# Patient Record
Sex: Female | Born: 1970 | Race: White | Hispanic: No | Marital: Married | Smoking: Former smoker
Health system: Southern US, Community
[De-identification: ages and names within clinical notes are randomized; demographics above are authoritative.]

---

## 2010-01-21 ENCOUNTER — Encounter (INDEPENDENT_AMBULATORY_CARE_PROVIDER_SITE_OTHER): Payer: Self-pay | Admitting: Obstetrics and Gynecology

## 2010-01-21 ENCOUNTER — Inpatient Hospital Stay (HOSPITAL_COMMUNITY): Admission: RE | Admit: 2010-01-21 | Discharge: 2010-01-24 | Payer: Self-pay | Admitting: Obstetrics and Gynecology

## 2011-03-05 LAB — CBC
HCT: 36.6 % (ref 36.0–46.0)
HCT: 41.5 % (ref 36.0–46.0)
Hemoglobin: 12.2 g/dL (ref 12.0–15.0)
Hemoglobin: 12.6 g/dL (ref 12.0–15.0)
MCHC: 33.5 g/dL (ref 30.0–36.0)
MCHC: 34.6 g/dL (ref 30.0–36.0)
MCV: 89.3 fL (ref 78.0–100.0)
Platelets: 127 10*3/uL — ABNORMAL LOW (ref 150–400)
RBC: 4.1 MIL/uL (ref 3.87–5.11)
RBC: 4.11 MIL/uL (ref 3.87–5.11)
WBC: 7.7 10*3/uL (ref 4.0–10.5)
WBC: 8.1 10*3/uL (ref 4.0–10.5)
WBC: 8.6 10*3/uL (ref 4.0–10.5)

## 2011-03-05 LAB — TYPE AND SCREEN: ABO/RH(D): B POS

## 2011-03-05 LAB — ABO/RH: ABO/RH(D): B POS

## 2012-07-08 ENCOUNTER — Encounter: Payer: Self-pay | Admitting: Family Medicine

## 2012-07-08 ENCOUNTER — Ambulatory Visit (INDEPENDENT_AMBULATORY_CARE_PROVIDER_SITE_OTHER): Payer: 59 | Admitting: Family Medicine

## 2012-07-08 VITALS — BP 92/54 | HR 67 | Temp 98.2°F | Resp 16 | Ht 69.25 in | Wt 184.2 lb

## 2012-07-08 DIAGNOSIS — E7800 Pure hypercholesterolemia, unspecified: Secondary | ICD-10-CM

## 2012-07-08 DIAGNOSIS — R031 Nonspecific low blood-pressure reading: Secondary | ICD-10-CM

## 2012-07-08 DIAGNOSIS — E663 Overweight: Secondary | ICD-10-CM | POA: Insufficient documentation

## 2012-07-08 LAB — COMPREHENSIVE METABOLIC PANEL
Alkaline Phosphatase: 42 U/L (ref 39–117)
BUN: 15 mg/dL (ref 6–23)
CO2: 27 mEq/L (ref 19–32)
Creat: 0.83 mg/dL (ref 0.50–1.10)
Glucose, Bld: 77 mg/dL (ref 70–99)
Sodium: 137 mEq/L (ref 135–145)
Total Bilirubin: 0.7 mg/dL (ref 0.3–1.2)

## 2012-07-08 LAB — LIPID PANEL
Cholesterol: 210 mg/dL — ABNORMAL HIGH (ref 0–200)
HDL: 43 mg/dL (ref >39)
LDL Cholesterol: 138 mg/dL — ABNORMAL HIGH (ref 0–99)
Total CHOL/HDL Ratio: 4.9 Ratio
Triglycerides: 145 mg/dL (ref <150)
VLDL: 29 mg/dL (ref 0–40)

## 2012-07-08 NOTE — Progress Notes (Signed)
S: Pt has hx of high cholesterol for years. Also thinks both parents take statin drugs. Nutrition is fairly healthy and pt stays active with 2 small children.     Re: Low BP- always SBP </= 100. She denies and dizziness or lightheadedness, palpitations or chest discomfort. Maintains good hydration.       ROS: Noncontributory except as per HPI   O: Vital signs and nurse notes reviewed   In NAD; WN, WD      HENT: [place]/AT;  EOMI, conj/sclerae clear      COR: RRR      LUNGS: Normal resp rate and effort      NEURO: Nonfocal  A/P: 1. Hypercholesterolemia  Comprehensive metabolic panel, Lipid panel, TSH  2. Low blood pressure, not hypotension  Monitor; maintain good hydration and can be a little liberal with salt intake

## 2012-07-08 NOTE — Patient Instructions (Signed)

## 2012-07-12 NOTE — Progress Notes (Signed)
Quick Note:  Please call pt and advise that the following labs are abnormal... Lipid levels are mildly elevated. Continue to work on healthy nutrition and regular exercise. Try Flax seed Oil supplement- 1000 to 1200 mg capsule- and take 1 capsule daily.  You may want to have lipids rechecked in 12 months.  Copy to pt. ______

## 2012-09-19 ENCOUNTER — Other Ambulatory Visit: Payer: Self-pay | Admitting: Obstetrics and Gynecology

## 2012-09-19 DIAGNOSIS — Z1231 Encounter for screening mammogram for malignant neoplasm of breast: Secondary | ICD-10-CM

## 2012-10-20 ENCOUNTER — Ambulatory Visit
Admission: RE | Admit: 2012-10-20 | Discharge: 2012-10-20 | Disposition: A | Discharge status: Home / Self Care | Payer: 59 | Source: Ambulatory Visit | Attending: Obstetrics and Gynecology | Admitting: Obstetrics and Gynecology

## 2012-10-20 DIAGNOSIS — Z1231 Encounter for screening mammogram for malignant neoplasm of breast: Secondary | ICD-10-CM

## 2013-09-27 ENCOUNTER — Other Ambulatory Visit: Payer: Self-pay

## 2013-09-27 DIAGNOSIS — Z1231 Encounter for screening mammogram for malignant neoplasm of breast: Secondary | ICD-10-CM

## 2013-10-23 ENCOUNTER — Ambulatory Visit
Admission: RE | Admit: 2013-10-23 | Discharge: 2013-10-23 | Disposition: A | Discharge status: Home / Self Care | Payer: 59 | Source: Ambulatory Visit

## 2013-10-23 DIAGNOSIS — Z1231 Encounter for screening mammogram for malignant neoplasm of breast: Secondary | ICD-10-CM

## 2015-03-04 ENCOUNTER — Ambulatory Visit (INDEPENDENT_AMBULATORY_CARE_PROVIDER_SITE_OTHER): Payer: 59 | Admitting: Internal Medicine

## 2015-03-04 VITALS — BP 110/64 | HR 64 | Temp 97.9°F | Resp 18 | Ht 69.25 in | Wt 188.6 lb

## 2015-03-04 DIAGNOSIS — Z23 Encounter for immunization: Secondary | ICD-10-CM | POA: Diagnosis not present

## 2015-03-04 NOTE — Patient Instructions (Signed)
Immunization Schedule, Adult  Influenza vaccine.  All adults should be immunized every year.  All adults, including pregnant women and people with hives-only allergy to eggs can receive the inactivated influenza (IIV) vaccine.  Adults aged 42-49 years can receive the recombinant influenza (RIV) vaccine. The RIV vaccine does not contain any egg protein.  Adults aged 76 years or older can receive the standard-dose IIV or the high-dose IIV.  Tetanus, diphtheria, and acellular pertussis (Td, Tdap) vaccine.  Pregnant women should receive 1 dose of Tdap vaccine during each pregnancy. The dose should be obtained regardless of the length of time since the last dose. Immunization is preferred during the 27th to 36th week of gestation.  An adult who has not previously received Tdap or who does not know his or her vaccine status should receive 1 dose of Tdap. This initial dose should be followed by tetanus and diphtheria toxoids (Td) booster doses every 10 years.  Adults with an unknown or incomplete history of completing a 3-dose immunization series with Td-containing vaccines should begin or complete a primary immunization series including a Tdap dose.  Adults should receive a Td booster every 10 years.  Varicella vaccine.  An adult without evidence of immunity to varicella should receive 2 doses or a second dose if he or she has previously received 1 dose.  Pregnant females who do not have evidence of immunity should receive the first dose after pregnancy. This first dose should be obtained before leaving the health care facility. The second dose should be obtained 4-8 weeks after the first dose.  Human papillomavirus (HPV) vaccine.  Females aged 13-26 years who have not received the vaccine previously should obtain the 3-dose series.  The vaccine is not recommended for use in pregnant females. However, pregnancy testing is not needed before receiving a dose. If a female is found to be  pregnant after receiving a dose, no treatment is needed. In that case, the remaining doses should be delayed until after the pregnancy.  Males aged 37-21 years who have not received the vaccine previously should receive the 3-dose series. Males aged 22-26 years may be immunized.  Immunization is recommended through the age of 93 years for any female who has sex with males and did not get any or all doses earlier.  Immunization is recommended for any person with an immunocompromised condition through the age of 71 years if he or she did not get any or all doses earlier.  During the 3-dose series, the second dose should be obtained 4-8 weeks after the first dose. The third dose should be obtained 24 weeks after the first dose and 16 weeks after the second dose.  Zoster vaccine.  One dose is recommended for adults aged 73 years or older unless certain conditions are present.  Measles, mumps, and rubella (MMR) vaccine.  Adults born before 35 generally are considered immune to measles and mumps.  Adults born in [birth year] or later should have 1 or more doses of MMR vaccine unless there is a contraindication to the vaccine or there is laboratory evidence of immunity to each of the three diseases.  A routine second dose of MMR vaccine should be obtained at least 28 days after the first dose for students attending postsecondary schools, health care workers, or international travelers.  People who received inactivated measles vaccine or an unknown type of measles vaccine during 1963-1967 should receive 2 doses of MMR vaccine.  People who received inactivated mumps vaccine or an unknown type  of mumps vaccine before 1979 and are at high risk for mumps infection should consider immunization with 2 doses of MMR vaccine.  For females of childbearing age, rubella immunity should be determined. If there is no evidence of immunity, females who are not pregnant should be vaccinated. If there is no evidence of  immunity, females who are pregnant should delay immunization until after pregnancy.  Unvaccinated health care workers born before 1957 who lack laboratory evidence of measles, mumps, or rubella immunity or laboratory confirmation of disease should consider measles and mumps immunization with 2 doses of MMR vaccine or rubella immunization with 1 dose of MMR vaccine.  Pneumococcal 13-valent conjugate (PCV13) vaccine.  When indicated, a person who is uncertain of his or her immunization history and has no record of immunization should receive the PCV13 vaccine.  An adult aged 19 years or older who has certain medical conditions and has not been previously immunized should receive 1 dose of PCV13 vaccine. This PCV13 should be followed with a dose of pneumococcal polysaccharide (PPSV23) vaccine. The PPSV23 vaccine dose should be obtained at least 8 weeks after the dose of PCV13 vaccine.  An adult aged 19 years or older who has certain medical conditions and previously received 1 or more doses of PPSV23 vaccine should receive 1 dose of PCV13. The PCV13 vaccine dose should be obtained 1 or more years after the last PPSV23 vaccine dose.  Pneumococcal polysaccharide (PPSV23) vaccine.  When PCV13 is also indicated, PCV13 should be obtained first.  All adults aged 65 years and older should be immunized.  An adult younger than age 65 years who has certain medical conditions should be immunized.  Any person who resides in a nursing home or long-term care facility should be immunized.  An adult smoker should be immunized.  People with an immunocompromised condition and certain other conditions should receive both PCV13 and PPSV23 vaccines.  People with human immunodeficiency virus (HIV) infection should be immunized as soon as possible after diagnosis.  Immunization during chemotherapy or radiation therapy should be avoided.  Routine use of PPSV23 vaccine is not recommended for American Indians,  [place] Natives, or people younger than 65 years unless there are medical conditions that require PPSV23 vaccine.  When indicated, people who have unknown immunization and have no record of immunization should receive PPSV23 vaccine.  One-time revaccination 5 years after the first dose of PPSV23 is recommended for people aged 19-64 years who have chronic kidney failure, nephrotic syndrome, asplenia, or immunocompromised conditions.  People who received 1-2 doses of PPSV23 before age 65 years should receive another dose of PPSV23 vaccine at age 65 years or later if at least 5 years have passed since the previous dose.  Doses of PPSV23 are not needed for people immunized with PPSV23 at or after age 65 years.  Meningococcal vaccine.  Adults with asplenia or persistent complement component deficiencies should receive 2 doses of quadrivalent meningococcal conjugate (MenACWY-D) vaccine. The doses should be obtained at least 2 months apart.  Microbiologists working with certain meningococcal bacteria, military recruits, people at risk during an outbreak, and people who travel to or live in countries with a high rate of meningitis should be immunized.  A first-year college student up through age 21 years who is living in a residence hall should receive a dose if he or she did not receive a dose on or after his or her 16th birthday.  Adults who have certain high-risk conditions should receive one or more doses   of vaccine.  Hepatitis A vaccine.  Adults who wish to be protected from this disease, have certain high-risk conditions, work with hepatitis A-infected animals, work in hepatitis A research labs, or travel to or work in countries with a high rate of hepatitis A should be immunized.  Adults who were previously unvaccinated and who anticipate close contact with an international adoptee during the first 60 days after arrival in the [place] from a country with a high rate of hepatitis A should  be immunized.  Hepatitis B vaccine.  Adults who wish to be protected from this disease, have certain high-risk conditions, may be exposed to blood or other infectious body fluids, are household contacts or sex partners of hepatitis B positive people, are clients or workers in certain care facilities, or travel to or work in countries with a high rate of hepatitis B should be immunized.  Haemophilus influenzae type b (Hib) vaccine.  A previously unvaccinated person with asplenia or sickle cell disease or having a scheduled splenectomy should receive 1 dose of Hib vaccine.  Regardless of previous immunization, a recipient of a hematopoietic stem cell transplant should receive a 3-dose series 6-12 months after his or her successful transplant.  Hib vaccine is not recommended for adults with HIV infection. Document Released: 02/20/2004 Document Revised: 03/27/2013 Document Reviewed: 01/17/2013 [facility] Patient Information 2015 [place], [place]. This information is not intended to replace advice given to you by your health care provider. Make sure you discuss any questions you have with your health care provider. Immunization Information for Foreign Travel Immunizations can protect you from certain diseases. Immunizations can also prevent the spread of certain infections. It is important to see your caregiver or a travel medicine specialist 4-6 weeks before you travel. This allows time for vaccines to take effect. It also provides enough time for you to get vaccines that must be given in a series over a period of days or weeks. Immunizations for travelers include:  Routine vaccines. These vaccines are standard for the people in a country.  Recommended vaccines. These vaccines are recommended before travel to some countries or regions.  Required vaccines. These vaccines are necessary before travel to specific countries or regions. If it is less than 4 weeks before you leave, you should still see your  caregiver. You might still benefit from vaccines or medicines. WHAT ARE THE ROUTINE VACCINES? Routine vaccines can protect you from diseases that are common in many parts of the world. Most routine vaccines are given at specific ages during your life. However, routine vaccines also include the annual flu (influenza) vaccine. You should be up to date on your routine immunizations before you travel. Your caregiver will be able to review your vaccine history and determine whether you have had all the routine vaccines. You may be advised to get extra doses or booster vaccines even if you are up to date on the routine vaccines. WHAT ARE THE RECOMMENDED VACCINES? Know your travel schedule when you visit your caregiver. The vaccines recommended before foreign travel will depend on several factors, including:  The country or countries of travel.  Whether you will travel to rural areas.  The length of time you will be traveling.  The season of the year.  Your age.  Your health status.  Your previous immunizations. Vaccine recommendations change over time. Your caregiver can tell you what vaccines are recommended before your trip. The annual influenza vaccine sometimes differs for the [place] and [place] hemispheres. Unless the annual vaccines  are the same in both hemispheres, people with certain chronic medical conditions who are traveling to the other hemisphere shortly before or during the influenza season should also get the other influenza vaccine. The other influenza vaccine should be obtained either before leaving the country or shortly after arrival at the travel site. WHAT ARE THE REQUIRED VACCINES? Vaccines may be required during a current outbreak of an infectious disease in a country or region. Your caregiver will be able to tell you about any current outbreaks and required vaccines. For example, proof of yellow fever immunization is currently required for most people before traveling to  certain countries in [place] and [place]. This vaccine can only be obtained at approved centers. You should get the yellow fever vaccine at least 10 days before your trip. After 10 days, most people show immunity to yellow fever. If it has been longer than 10 years since you received the yellow fever vaccine, another dose is required. If proof of immunization is incomplete or inaccurate, you could be quarantined, denied entry, or given another dose of vaccine at the travel site. If you cannot receive the yellow fever vaccine because of medical reasons, you must have a written statement from your caregiver. The statement must contain a medical reason for the lack of immunization. In such a case, your caregiver should then give you advice on how to decrease your chance of getting yellow fever. That advice should include taking precautions to avoid mosquito bites and limiting outdoor time. Other than having a medical condition or being under the age of 36 months, no other reasons will be accepted for not getting the vaccine.  Proof of meningococcal immunization is required by the [place] for any person older than 2 years who is taking part in the [place] or [place]. Visas for traveling to the [place] or [place] will not even be issued until there is proof of immunization. You should get this vaccine at least 10 days before your trip. After 10 days, most people show immunity. If it has been longer than 3 years since your last immunization, another dose is required. FOR MORE INFORMATION  Centers for Disease Control and Prevention (CDC): http://www.wolf.info/  World Health Organization [facility]): RoleLink.com.br Document Released: 11/18/2009 Document Revised: 04/16/2014 Document Reviewed: 10/28/2012 [facility] Patient Information 2015 [place], [place]. This information is not intended to replace advice given to you by your health care provider. Make sure you discuss any questions you have with your health care  provider.

## 2015-03-04 NOTE — Progress Notes (Signed)
   Subjective:    Patient ID: Lindsey Krueger, female    DOB: 1971-06-14, 44 y.o.   MRN: [id number]  HPI Scribed by Remi HaggardJulie Greer LPN  44 year old female going to [place] 03/09/15 update on immunization.  Agrees to Hep A first dose, Tdap . Will consider flu shot after vacation.    Review of Systems     Objective:   Physical Exam  Constitutional: She is oriented to person, place, and time. She appears well-developed and well-nourished.  HENT:  Head: Normocephalic.  Eyes: EOM are normal. No scleral icterus.  Neck: Normal range of motion.  Pulmonary/Chest: Effort normal.  Neurological: She is alert and oriented to person, place, and time. She exhibits normal muscle tone. Coordination normal.  Psychiatric: She has a normal mood and affect.          Assessment & Plan:  Tdap/First Hep A Return 6 months second hep A

## 2015-04-25 LAB — HM PAP SMEAR: HM Pap smear: NEGATIVE

## 2015-05-02 ENCOUNTER — Encounter: Payer: Self-pay | Admitting: Physician Assistant

## 2015-06-12 ENCOUNTER — Ambulatory Visit (INDEPENDENT_AMBULATORY_CARE_PROVIDER_SITE_OTHER): Payer: 59 | Admitting: Physician Assistant

## 2015-06-12 VITALS — BP 121/82 | HR 81 | Temp 98.9°F | Resp 18 | Wt 189.0 lb

## 2015-06-12 DIAGNOSIS — J9801 Acute bronchospasm: Secondary | ICD-10-CM | POA: Diagnosis not present

## 2015-06-12 DIAGNOSIS — J209 Acute bronchitis, unspecified: Secondary | ICD-10-CM | POA: Diagnosis not present

## 2015-06-12 MED ORDER — HYDROCOD POLST-CPM POLST ER 10-8 MG/5ML PO SUER: 5.0000 mL | Freq: Two times a day (BID) | ORAL | Status: DC | PRN

## 2015-06-12 MED ORDER — ALBUTEROL SULFATE (2.5 MG/3ML) 0.083% IN NEBU
2.5000 mg | INHALATION_SOLUTION | Freq: Once | RESPIRATORY_TRACT | Status: AC
  Administered 2015-06-12: 2.5 mg via RESPIRATORY_TRACT

## 2015-06-12 MED ORDER — IPRATROPIUM BROMIDE 0.02 % IN SOLN
0.5000 mg | Freq: Once | RESPIRATORY_TRACT | Status: AC
  Administered 2015-06-12: 0.5 mg via RESPIRATORY_TRACT

## 2015-06-12 MED ORDER — ALBUTEROL SULFATE HFA 108 (90 BASE) MCG/ACT IN AERS: 2.0000 | INHALATION_SPRAY | RESPIRATORY_TRACT | Status: AC | PRN

## 2015-06-12 NOTE — Patient Instructions (Signed)
Continue mucinex twice a day. Cough syrup at night for sleep. Use albuterol inhaler every 4 hours as needed for chest tightness/coughing fits. Call in 7-10 days if your symptoms are still not improving.

## 2015-06-12 NOTE — Progress Notes (Signed)
[facility] [address], [place] [address] [phone]  Date:  06/12/2015   Name:  Lindsey Krueger   DOB:  21-Nov-1971   MRN:  [id number]  PCP:  No primary care provider on file.    Chief Complaint: Cough and URI   History of Present Illness:  This is a 44 y.o. female who is presenting with URI symptoms x 2 weeks.  Cough: Started with cough. productive of white-green thick mucus. Worse in the morning. She is awoken frequently at night from coughing. Chest feels tight. She has frequent coughing fits. SOB/wheezing: no Nasal congestion: started in past 2 days. Otalgia: No Sore throat: No Fever/chills: No  Aggravating/alleviating factors: robitussin and mucinex but no significant help.  Sick contacts: 60 year old son with same illness, lasted 2 weeks, but better now. History of asthma: no History of env allergies: yes but usually only in spring. Already had problems with allergies this year and cleared up. Doesn't take anything daily for allergies. Tobacco use: No   Review of Systems:  Review of Systems See HPI.  Patient Active Problem List   Diagnosis Date Noted  . Hypercholesterolemia 07/08/2012  . Low blood pressure, not hypotension 07/08/2012  . Overweight (BMI 25.0-29.9) 07/08/2012    Prior to Admission medications   Not on File    Allergies  Allergen Reactions  . Bactrim [Sulfamethoxazole-Trimethoprim] Other (See Comments)    Not sure  . Sulfa Antibiotics Other (See Comments)    Per pt told by mother, not sure what happens    Past Surgical History  Procedure Laterality Date  . Cesarean section      twice    History  Substance Use Topics  . Smoking status: Former Smoker -- 1.00 packs/day for 7 years    Types: Cigarettes    Quit date: 11/07/1997  . Smokeless tobacco: Not on file  . Alcohol Use: Yes     Comment: drink mixed, beer, and wine    Family History  Problem Relation Age of Onset  . Diabetes Father 23    type 2   . Cancer Maternal Grandmother 42    pancreatic   . Cancer Maternal Grandfather     prostate    Medication list has been reviewed and updated.  Physical Examination:  Physical Exam  Constitutional: She is oriented to person, place, and time. She appears well-developed and well-nourished. No distress.  HENT:  Head: Normocephalic and atraumatic.  Right Ear: Hearing, tympanic membrane, external ear and ear canal normal.  Left Ear: Hearing, tympanic membrane, external ear and ear canal normal.  Nose: Nose normal. Right sinus exhibits no maxillary sinus tenderness and no frontal sinus tenderness. Left sinus exhibits no maxillary sinus tenderness and no frontal sinus tenderness.  Mouth/Throat: Uvula is midline, oropharynx is clear and moist and mucous membranes are normal.  Eyes: Conjunctivae and lids are normal. Right eye exhibits no discharge. Left eye exhibits no discharge. No scleral icterus.  Cardiovascular: Normal rate, regular rhythm, normal heart sounds and normal pulses.   No murmur heard. Pulmonary/Chest: Effort normal. No respiratory distress. She has decreased breath sounds (mildly, throughout). She has no wheezes. She has no rhonchi. She has no rales.  Musculoskeletal: Normal range of motion.  Lymphadenopathy:       Head (right side): No submental, no submandibular and no tonsillar adenopathy present.       Head (left side): No submental, no submandibular and no tonsillar adenopathy present.  She has no cervical adenopathy.  Neurological: She is alert and oriented to person, place, and time.  Skin: Skin is warm, dry and intact. No lesion and no rash noted.  Psychiatric: She has a normal mood and affect. Her speech is normal and behavior is normal. Thought content normal.   BP 121/82 mmHg  Pulse 81  Temp(Src) 98.9 F (37.2 C) (Oral)  Resp 18  Wt 189 lb (85.73 kg)  SpO2 99%  LMP 05/24/2015 (LMP Unknown)  Assessment and Plan:  1. Bronchospasm 2. Acute  bronchitis Symptoms improved with duoneb. Vitals normal. Pt is feeling ok except for lingering cough. Pt will continue BID home mucinex. Prescribed tussionex and prn albuterol. Return in 7-10 days if symptoms not improving. - albuterol (PROVENTIL) (2.5 MG/3ML) 0.083% nebulizer solution 2.5 mg; Take 3 mLs (2.5 mg total) by nebulization once. - ipratropium (ATROVENT) nebulizer solution 0.5 mg; Take 2.5 mLs (0.5 mg total) by nebulization once. - albuterol (PROVENTIL HFA;VENTOLIN HFA) 108 (90 BASE) MCG/ACT inhaler; Inhale 2 puffs into the lungs every 4 (four) hours as needed for wheezing or shortness of breath (cough, shortness of breath or wheezing.).  Dispense: 1 Inhaler; Refill: 1 - chlorpheniramine-HYDROcodone (TUSSIONEX PENNKINETIC ER) 10-8 MG/5ML SUER; Take 5 mLs by mouth every 12 (twelve) hours as needed for cough.  Dispense: 100 mL; Refill: 0   Roswell MinersNicole V. Dyke BrackettBush, PA-C, MHS [facility]  06/12/2015

## 2015-06-19 ENCOUNTER — Telehealth: Payer: Self-pay

## 2015-06-19 NOTE — Telephone Encounter (Signed)
Pt is not feeling any better and would like to know what to do next

## 2015-06-20 NOTE — Telephone Encounter (Signed)
Advised pt to RTC per Nicole's OV note. Pt understood.

## 2015-06-21 ENCOUNTER — Ambulatory Visit (INDEPENDENT_AMBULATORY_CARE_PROVIDER_SITE_OTHER): Payer: 59 | Admitting: Family Medicine

## 2015-06-21 ENCOUNTER — Ambulatory Visit (INDEPENDENT_AMBULATORY_CARE_PROVIDER_SITE_OTHER): Payer: 59

## 2015-06-21 VITALS — BP 116/76 | HR 64 | Temp 98.4°F | Resp 16 | Ht 70.0 in | Wt 195.0 lb

## 2015-06-21 DIAGNOSIS — R059 Cough, unspecified: Secondary | ICD-10-CM

## 2015-06-21 DIAGNOSIS — R058 Other specified cough: Secondary | ICD-10-CM

## 2015-06-21 DIAGNOSIS — R6884 Jaw pain: Secondary | ICD-10-CM

## 2015-06-21 DIAGNOSIS — J0100 Acute maxillary sinusitis, unspecified: Secondary | ICD-10-CM | POA: Diagnosis not present

## 2015-06-21 DIAGNOSIS — R05 Cough: Secondary | ICD-10-CM

## 2015-06-21 MED ORDER — PREDNISONE 20 MG PO TABS
ORAL_TABLET | ORAL | Status: DC
Start: 2015-06-21 — End: 2015-09-25

## 2015-06-21 MED ORDER — DOXYCYCLINE HYCLATE 100 MG PO TABS: 100.0000 mg | ORAL_TABLET | Freq: Two times a day (BID) | ORAL | Status: DC

## 2015-06-21 MED ORDER — BENZONATATE 100 MG PO CAPS: 100.0000 mg | ORAL_CAPSULE | Freq: Three times a day (TID) | ORAL | Status: DC | PRN

## 2015-06-21 NOTE — Progress Notes (Addendum)
  Subjective:  Patient ID: Lindsey DohenyCourtney L Nunnelley, female    DOB: 24-Nov-1971  Age: 44 y.o. MRN: [id number]  44 year old lady who has had a respiratory tract infection treated several weeks ago. She has used Tussionex at bedtime, cannot take in the daytime. He makes her too drowsy. Her husband had a cough and seems to have responded to amoxicillin. She does not smoke. She is not coughing up much stuff. She has started blowing stuff out of her head. She hurts her left maxillary sinus region. She works, was down in [place] days with her job. Not feverish.  Past chart and family, social, and medical history reviewed   Objective:   Constant cough. Is pretty miserable with a cough. Her TMs are normal. Tender mildly on the left maxillary sinus. Nose congested. Throat clear. Neck supple without nodes thyromegaly. Chest clear to auscultation. Heart regular without murmurs. Abdomen soft without mass or tenderness.  [facility] reading (PRIMARY) by  Dr. Alwyn RenHopper. Normal chest. Normal sinuses    Assessment & Plan:   Assessment: Postviral cough syndrome  Plan: Patient Instructions  Drink plenty of fluids and get enough rest  Continue using the Tussionex cough syrup at bedtime  Take the benzonatate cough pills one or 2 pills 3 times daily as needed for daytime cough  You can take an over-the-counter DM-containing product for cough also in addition to the above if needed  Take the doxycycline one twice daily for infection  Take the prednisone 3 pills daily for 2 days, then 2 daily for 2 days, then 1 daily for 2 days, then one half daily for 4 days for inflammation in the lungs  Return if not improving, or if abruptly worse at anytime go to the emergency room    HOPPER,DAVID, MD 06/21/2015

## 2015-06-21 NOTE — Patient Instructions (Signed)
Drink plenty of fluids and get enough rest  Continue using the Tussionex cough syrup at bedtime  Take the benzonatate cough pills one or 2 pills 3 times daily as needed for daytime cough  You can take an over-the-counter DM-containing product for cough also in addition to the above if needed  Take the doxycycline one twice daily for infection  Take the prednisone 3 pills daily for 2 days, then 2 daily for 2 days, then 1 daily for 2 days, then one half daily for 4 days for inflammation in the lungs  Return if not improving, or if abruptly worse at anytime go to the emergency room

## 2015-09-06 ENCOUNTER — Ambulatory Visit: Payer: 59

## 2015-09-12 ENCOUNTER — Ambulatory Visit (INDEPENDENT_AMBULATORY_CARE_PROVIDER_SITE_OTHER): Payer: 59 | Admitting: *Deleted

## 2015-09-12 DIAGNOSIS — Z23 Encounter for immunization: Secondary | ICD-10-CM | POA: Diagnosis not present

## 2015-09-12 NOTE — Progress Notes (Deleted)
   Subjective:    Patient ID: Lindsey Krueger, female    DOB: 02/25/1971, 44 y.o.   MRN: [id number]  HPI    Review of Systems     Objective:   Physical Exam        Assessment & Plan:

## 2015-09-12 NOTE — Progress Notes (Signed)
Patient here today for 2nd Hep A vaccine. Given in left deltoid patient tolerated well

## 2015-09-25 ENCOUNTER — Encounter: Payer: Self-pay | Admitting: Family Medicine

## 2015-09-25 ENCOUNTER — Ambulatory Visit (INDEPENDENT_AMBULATORY_CARE_PROVIDER_SITE_OTHER): Payer: 59 | Admitting: Family Medicine

## 2015-09-25 VITALS — BP 111/75 | HR 76 | Temp 98.0°F | Resp 16 | Ht 69.5 in | Wt 195.8 lb

## 2015-09-25 DIAGNOSIS — R519 Headache, unspecified: Secondary | ICD-10-CM

## 2015-09-25 DIAGNOSIS — R51 Headache: Secondary | ICD-10-CM

## 2015-09-25 DIAGNOSIS — J309 Allergic rhinitis, unspecified: Secondary | ICD-10-CM | POA: Diagnosis not present

## 2015-09-25 NOTE — Progress Notes (Signed)
Subjective:    Patient ID: Lindsey Krueger, female    DOB: 08/28/1971, 44 y.o.   MRN: [id number]  HPI This is a 44 yo female who presents today with 3 days of off and on headache. Either a band across her eyes or a band across top. Pain 3-6/10. [place]. Had for several days, then started feeling better. Pain started back and she noticed pain with bending over and coughing. Temporary relief with ibuprofen, tylenol and peppermint oil. Yesterday she took a 12 hour Sudafed and ibuprofen 600 mg and used a cooling mask. Had some relief for several hours, but had recurrence. Took Excedrin migraine last night and had good relief for about12 hours and slept well. Has not taken anything today and has had pain return this afternoon to 3/10.   She wonders if symptoms related to allergies/barometric pressure. Has noticed more frequent post nasal drainage, throat clearing. She got a new dog a couple of months ago.   No past medical history on file. Past Surgical History  Procedure Laterality Date  . Cesarean section      twice   Family History  Problem Relation Age of Onset  . Diabetes Father 3360    type 2  . Cancer Maternal Grandmother 3771    pancreatic   . Cancer Maternal Grandfather     prostate   Social History  Substance Use Topics  . Smoking status: Former Smoker -- 1.00 packs/day for 7 years    Types: Cigarettes    Quit date: 11/07/1997  . Smokeless tobacco: None  . Alcohol Use: Yes     Comment: drink mixed, beer, and wine    Review of Systems No visual changes, no light or sound sensitivity. No fever/chills, occasional cough. No ear pain, no sore throat, clear nasal drainage. No neck pain or stiffness.     Objective:   Physical Exam  Constitutional: She is oriented to person, place, and time. She appears well-developed and well-nourished. No distress.  HENT:  Head: Normocephalic and atraumatic.  Right Ear: External ear and  ear canal normal.  Left Ear: External ear and ear canal normal.  Nose: Mucosal edema and rhinorrhea present. Right sinus exhibits no maxillary sinus tenderness and no frontal sinus tenderness. Left sinus exhibits no maxillary sinus tenderness and no frontal sinus tenderness.  Mouth/Throat: Uvula is midline and mucous membranes are normal. Posterior oropharyngeal erythema present. No oropharyngeal exudate, posterior oropharyngeal edema or tonsillar abscesses.  Bilateral TMs dull.   Eyes: Conjunctivae and EOM are normal. Pupils are equal, round, and reactive to light. Right eye exhibits no discharge. Left eye exhibits no discharge. No scleral icterus.  Neck: Normal range of motion. Neck supple.  Cardiovascular: Normal rate, regular rhythm and normal heart sounds.   Pulmonary/Chest: Effort normal and breath sounds normal. No respiratory distress. She has no wheezes. She has no rales. She exhibits no tenderness.  Musculoskeletal: Normal range of motion.  Lymphadenopathy:    She has no cervical adenopathy.  Neurological: She is alert and oriented to person, place, and time. She has normal reflexes. No cranial nerve deficit.  Skin: Skin is warm and dry. She is not diaphoretic.  Psychiatric: She has a normal mood and affect. Her behavior is normal. Judgment and thought content normal.  Vitals reviewed.  BP 111/75 mmHg  Pulse 76  Temp(Src) 98 F (36.7 C) (Oral)  Resp 16  Ht 5' 9.5" (1.765 m)  Wt 195  lb 12.8 oz (88.814 kg)  BMI 28.51 kg/m2 Wt Readings from Last 3 Encounters:  09/25/15 195 lb 12.8 oz (88.814 kg)  06/21/15 195 lb (88.451 kg)  06/12/15 189 lb (85.73 kg)   Depression screen [facility] 2/9 09/25/2015 06/21/2015 06/12/2015  Decreased Interest 0 0 0  Down, Depressed, Hopeless 0 0 0  PHQ - 2 Score 0 0 0      Assessment & Plan:  1. Allergic rhinitis, unspecified allergic rhinitis type - discussed symptoms and provided written patient information including OTC treatments including daily  antihistamine, can add Flonase, saline spray, decongestant in the morning.  2. Headache, unspecified headache type - Headache has not been intractable, no worrisome characteristics, will treat allergy symptoms as above and she can continue Excedrin Migraine as that gave her good relief - Discussed RTC/ER precautions- visual changes, worsening pain, dizziness/lightheadedness/weakness.  - Follow up if no improvement in 2-3 days   Olean Ree, FNP-BC  [facility], [facility]  09/25/2015 5:23 PM

## 2015-09-25 NOTE — Patient Instructions (Signed)
Take daily antihistamine  Take sudafed once a day as needed (in the morning) Can take Excedrin migraine twice a day, avoid close to bedtime Can use saline nasal spray to rinse nasal passages  Allergic Rhinitis Allergic rhinitis is when the mucous membranes in the nose respond to allergens. Allergens are particles in the air that cause your body to have an allergic reaction. This causes you to release allergic antibodies. Through a chain of events, these eventually cause you to release histamine into the blood stream. Although meant to protect the body, it is this release of histamine that causes your discomfort, such as frequent sneezing, congestion, and an itchy, runny nose.  CAUSES Seasonal allergic rhinitis (hay fever) is caused by pollen allergens that may come from grasses, trees, and weeds. Year-round allergic rhinitis (perennial allergic rhinitis) is caused by allergens such as house dust mites, pet dander, and mold spores. SYMPTOMS  Nasal stuffiness (congestion).  Itchy, runny nose with sneezing and tearing of the eyes. DIAGNOSIS Your health care provider can help you determine the allergen or allergens that trigger your symptoms. If you and your health care provider are unable to determine the allergen, skin or blood testing may be used. Your health care provider will diagnose your condition after taking your health history and performing a physical exam. Your health care provider may assess you for other related conditions, such as asthma, pink eye, or an ear infection. TREATMENT Allergic rhinitis does not have a cure, but it can be controlled by:  Medicines that block allergy symptoms. These may include allergy shots, nasal sprays, and oral antihistamines.  Avoiding the allergen. Hay fever may often be treated with antihistamines in pill or nasal spray forms. Antihistamines block the effects of histamine. There are over-the-counter medicines that may help with nasal congestion and  swelling around the eyes. Check with your health care provider before taking or giving this medicine. If avoiding the allergen or the medicine prescribed do not work, there are many new medicines your health care provider can prescribe. Stronger medicine may be used if initial measures are ineffective. Desensitizing injections can be used if medicine and avoidance does not work. Desensitization is when a patient is given ongoing shots until the body becomes less sensitive to the allergen. Make sure you follow up with your health care provider if problems continue. HOME CARE INSTRUCTIONS It is not possible to completely avoid allergens, but you can reduce your symptoms by taking steps to limit your exposure to them. It helps to know exactly what you are allergic to so that you can avoid your specific triggers. SEEK MEDICAL CARE IF:  You have a fever.  You develop a cough that does not stop easily (persistent).  You have shortness of breath.  You start wheezing.  Symptoms interfere with normal daily activities.   This information is not intended to replace advice given to you by your health care provider. Make sure you discuss any questions you have with your health care provider.   Document Released: 08/25/2001 Document Revised: 12/21/2014 Document Reviewed: 08/07/2013 Elsevier Interactive Patient Education [employer]2016 Elsevier Inc.

## 2015-10-15 ENCOUNTER — Ambulatory Visit: Payer: 59 | Admitting: Allergy and Immunology

## 2015-11-05 ENCOUNTER — Encounter: Payer: Self-pay | Admitting: Allergy and Immunology

## 2015-11-05 ENCOUNTER — Ambulatory Visit (INDEPENDENT_AMBULATORY_CARE_PROVIDER_SITE_OTHER): Payer: 59 | Admitting: Allergy and Immunology

## 2015-11-05 VITALS — BP 120/78 | HR 80 | Temp 97.9°F | Resp 16

## 2015-11-05 DIAGNOSIS — J309 Allergic rhinitis, unspecified: Secondary | ICD-10-CM

## 2015-11-05 DIAGNOSIS — L259 Unspecified contact dermatitis, unspecified cause: Secondary | ICD-10-CM | POA: Insufficient documentation

## 2015-11-05 DIAGNOSIS — R05 Cough: Secondary | ICD-10-CM | POA: Diagnosis not present

## 2015-11-05 DIAGNOSIS — R059 Cough, unspecified: Secondary | ICD-10-CM

## 2015-11-05 DIAGNOSIS — H1010 Acute atopic conjunctivitis, unspecified eye: Secondary | ICD-10-CM | POA: Diagnosis not present

## 2015-11-05 MED ORDER — MONTELUKAST SODIUM 10 MG PO TABS: 10.0000 mg | ORAL_TABLET | Freq: Every day | ORAL | Status: AC

## 2015-11-05 NOTE — Patient Instructions (Addendum)
  1. Allergen avoidance measures  2. Treat and prevent inflammation:   A. OTC Rhinocort - one spray each nostril one time per day  B. Montelukast 10mg  - one tablet one time per day  4. If needed:   A. OTC antihistamine - Allergra  B. Nasal saline  C. Mucinex DM  5. Consider laser treatment to tattoo to breakdown red dye - continue clobetasol as per Dermatology  6. Contact clinic if recurrent headaches or coughing  7. Return in 6 months or earlier if problem.

## 2015-11-05 NOTE — Progress Notes (Signed)
[facility]    NEW PATIENT NOTE  Referring Provider: No ref. provider found Primary Provider: No primary care provider on file.    Subjective:   Patient ID: Lindsey Krueger is a 44 y.o. female with a chief complaint of Allergic Reaction and Allergic Rhinitis   who presents to the clinic with the following problems:  HPI Comments:  Lindsey Krueger presents to this clinic on 11/05/2015 in evaluation of allergies. Lindsey Krueger has a long history of recurrent problems with nasal congestion and nose blowing and sneezing that is not a particularly big issue but has become somewhat more active this past year. It sounds as though she is contracted several episodes of sinusitis this year associated with significant headache. Fortunately, there does not appear to be a history of daily headache nor history of headache other than what occurs in the setting of sinusitis. As well, she did have a prolonged episode of cough this summer that was deemed to be "bronchitis" in the setting of sinusitis. She apparently had a chest x-ray and a sinus x-ray both of which did not identify any significant abnormality. She does not have a history of exercise-induced bronchospastic symptoms or cold air induced bronchospastic symptoms and has not had a history of childhood asthma. She's had no cough since that episode has resolved the summer. There is no provoking factor giving rise to her respiratory tract symptoms. She has had some associated eye symptoms one living at [place] being exposed to [place] but otherwise there is no trigger while living in [place].  Lindsey Krueger is also developed a problem with redness and swelling on her skin at areas where she had a tattoo placed on her right ankle. It appears as though it is the red dye that she is reacting to. She visited with her dermatologist to give her some clobetasol which is helping with the inflammation but is producing  some degree of hypopigmentation around that area.   History reviewed. No pertinent past medical history.  Past Surgical History  Procedure Laterality Date  . Cesarean section      twice    Outpatient Encounter Prescriptions as of 11/05/2015  Medication Sig  . albuterol (PROVENTIL HFA;VENTOLIN HFA) 108 (90 BASE) MCG/ACT inhaler Inhale 2 puffs into the lungs every 4 (four) hours as needed for wheezing or shortness of breath (cough, shortness of breath or wheezing.).   No facility-administered encounter medications on file as of 11/05/2015.    No orders of the defined types were placed in this encounter.    Allergies  Allergen Reactions  . Bactrim [Sulfamethoxazole-Trimethoprim] Other (See Comments)    Not sure  . Sulfa Antibiotics Other (See Comments)    Per pt told by mother, not sure what happens    Review of Systems  Constitutional: Negative.   HENT: Positive for congestion and sneezing.   Eyes: Negative.   Respiratory: Negative.   Cardiovascular: Negative.   Gastrointestinal: Negative.   Musculoskeletal: Negative.   Skin: Positive for rash.  Allergic/Immunologic: Negative for food allergies and immunocompromised state.  Neurological: Negative.   Hematological: Negative for adenopathy. Does not bruise/bleed easily.    Family History  Problem Relation Age of Onset  . Diabetes Father 31    type 2  . Cancer Maternal Grandmother 38    pancreatic   . Cancer Maternal Grandfather     prostate  . Breast cancer Maternal Aunt     Social History   Social  History  . Marital Status: Married    Spouse Name: N/A  . Number of Children: N/A  . Years of Education: N/A   Occupational History  . Not on file.   Social History Main Topics  . Smoking status: Former Smoker -- 1.00 packs/day for 7 years    Types: Cigarettes    Quit date: 11/07/1997  . Smokeless tobacco: Never Used  . Alcohol Use: Yes     Comment: drink mixed, beer, and wine  . Drug Use: Not on file  .  Sexual Activity: Not on file   Other Topics Concern  . Not on file   Social History Narrative    Environmental and Social history  Lives in a house with a dry environment, a cat and dog located inside the household, no carpeting in the bedroom, no plastic on the bed or pillows, no smokers located inside the household, and employment as a Gaffer.   Objective:   Filed Vitals:   11/05/15 0827  BP: 120/78  Pulse: 80  Temp: 97.9 F (36.6 C)  Resp: 16        Physical Exam  Constitutional: She appears well-developed and well-nourished. No distress.  HENT:  Head: Normocephalic and atraumatic. Head is without right periorbital erythema and without left periorbital erythema.  Right Ear: Tympanic membrane, external ear and ear canal normal. No drainage or tenderness. No foreign bodies. Tympanic membrane is not injected, not scarred, not perforated, not erythematous, not retracted and not bulging. No middle ear effusion.  Left Ear: Tympanic membrane, external ear and ear canal normal. No drainage or tenderness. No foreign bodies. Tympanic membrane is not injected, not scarred, not perforated, not erythematous, not retracted and not bulging.  No middle ear effusion.  Nose: Nose normal. No mucosal edema, rhinorrhea, nose lacerations or sinus tenderness.  No foreign bodies.  Mouth/Throat: Oropharynx is clear and moist. No oropharyngeal exudate, posterior oropharyngeal edema, posterior oropharyngeal erythema or tonsillar abscesses.  Eyes: Lids are normal. Right eye exhibits no chemosis, no discharge and no exudate. No foreign body present in the right eye. Left eye exhibits no chemosis, no discharge and no exudate. No foreign body present in the left eye. Right conjunctiva is not injected. Left conjunctiva is not injected.  Neck: Neck supple. No tracheal tenderness present. No tracheal deviation and no edema present. No thyroid mass and no thyromegaly present.   Cardiovascular: Normal rate, regular rhythm, S1 normal and S2 normal.  Exam reveals no gallop.   No murmur heard. Pulmonary/Chest: No accessory muscle usage or stridor. No respiratory distress. She has no wheezes. She has no rhonchi. She has no rales.  Abdominal: Soft. She exhibits no distension and no mass. There is no tenderness. There is no rebound and no guarding.  Musculoskeletal: She exhibits no edema or tenderness.  Lymphadenopathy:       Head (right side): No tonsillar adenopathy present.       Head (left side): No tonsillar adenopathy present.    She has no cervical adenopathy.  Neurological: She is alert.  Skin: Rash noted. She is not diaphoretic.  Right ankle tattoo with induration and slight erythema surrounding red dye and hypopigmentation surrounding entire tattoo  Psychiatric: She has a normal mood and affect. Her behavior is normal.    Diagnostics:  Allergy skin tests were performed. She demonstrated hypersensitivity against pollens including weeds and ragweed and house dust mite  Spirometry was performed and demonstrated an FEV1 of 4.06 @ 113 % of  predicted.   Assessment and Plan:    1. Allergic rhinoconjunctivitis      1. Allergen avoidance measures  2. Treat and prevent inflammation:   A. OTC Rhinocort - one spray each nostril one time per day  B. Montelukast 10mg  - one tablet one time per day  4. If needed:   A. OTC antihistamine - Allergra  B. Nasal saline  C. Mucinex DM  5. Consider laser treatment to tattoo to breakdown red dye - continue clobetasol as per Dermatology  6. Contact clinic if recurrent headaches or coughing  7. Return in 6 months or earlier if problem.  Lindsey Krueger will utilize preventative anti-inflammatory medications for her respiratory tract and I will assume that she will do well with this treatment plan and we will see her back in this clinic pending her response. Certainly if she fails medical therapy in the future she would be  a candidate for immunotherapy. Regarding her contact dermatitis directed against red dye in her tattoo, I think could be best for her to seek out counseling with an individual who does laser therapy to see if they can breakdown the red dye utilizing a certain frequency of laser. Otherwise, I suspect that she's going to go on to develop prolonged inflammation and subsequently significant induration and lichenification of this area.    Laurette SchimkeEric Brittain Smithey, MD [facility]

## 2018-09-14 ENCOUNTER — Other Ambulatory Visit: Payer: Self-pay | Admitting: Obstetrics and Gynecology

## 2018-09-14 DIAGNOSIS — R928 Other abnormal and inconclusive findings on diagnostic imaging of breast: Secondary | ICD-10-CM

## 2018-09-19 ENCOUNTER — Ambulatory Visit: Payer: Self-pay

## 2018-09-19 ENCOUNTER — Ambulatory Visit
Admission: RE | Admit: 2018-09-19 | Discharge: 2018-09-19 | Disposition: A | Discharge status: Home / Self Care | Payer: 59 | Source: Ambulatory Visit | Attending: Obstetrics and Gynecology | Admitting: Obstetrics and Gynecology

## 2018-09-19 DIAGNOSIS — R928 Other abnormal and inconclusive findings on diagnostic imaging of breast: Secondary | ICD-10-CM

## 2022-04-30 ENCOUNTER — Other Ambulatory Visit: Payer: Self-pay | Admitting: Obstetrics and Gynecology

## 2022-04-30 DIAGNOSIS — N6320 Unspecified lump in the left breast, unspecified quadrant: Secondary | ICD-10-CM

## 2022-05-08 ENCOUNTER — Ambulatory Visit
Admission: RE | Admit: 2022-05-08 | Discharge: 2022-05-08 | Disposition: A | Discharge status: Home / Self Care | Payer: 59 | Source: Ambulatory Visit | Attending: Obstetrics and Gynecology | Admitting: Obstetrics and Gynecology

## 2022-05-08 ENCOUNTER — Other Ambulatory Visit: Payer: Self-pay | Admitting: Obstetrics and Gynecology

## 2022-05-08 DIAGNOSIS — N6320 Unspecified lump in the left breast, unspecified quadrant: Secondary | ICD-10-CM

## 2022-05-18 ENCOUNTER — Ambulatory Visit
Admission: RE | Admit: 2022-05-18 | Discharge: 2022-05-18 | Disposition: A | Discharge status: Home / Self Care | Payer: 59 | Source: Ambulatory Visit | Attending: Obstetrics and Gynecology | Admitting: Obstetrics and Gynecology

## 2022-05-18 DIAGNOSIS — N6320 Unspecified lump in the left breast, unspecified quadrant: Secondary | ICD-10-CM

## 2022-06-22 ENCOUNTER — Other Ambulatory Visit: Payer: Self-pay | Admitting: Obstetrics and Gynecology

## 2022-06-22 DIAGNOSIS — R928 Other abnormal and inconclusive findings on diagnostic imaging of breast: Secondary | ICD-10-CM

## 2022-12-22 ENCOUNTER — Other Ambulatory Visit: Payer: 59

## 2023-01-25 ENCOUNTER — Ambulatory Visit
Admission: RE | Admit: 2023-01-25 | Discharge: 2023-01-25 | Disposition: A | Discharge status: Home / Self Care | Payer: 59 | Source: Ambulatory Visit | Attending: Obstetrics and Gynecology | Admitting: Obstetrics and Gynecology

## 2023-01-25 ENCOUNTER — Ambulatory Visit
Admission: RE | Admit: 2023-01-25 | Discharge: 2023-01-25 | Disposition: A | Discharge status: Home / Self Care | Payer: Commercial Managed Care - PPO | Source: Ambulatory Visit | Attending: Obstetrics and Gynecology | Admitting: Obstetrics and Gynecology

## 2023-01-25 DIAGNOSIS — R928 Other abnormal and inconclusive findings on diagnostic imaging of breast: Secondary | ICD-10-CM

## 2023-01-28 ENCOUNTER — Other Ambulatory Visit: Payer: Self-pay | Admitting: Obstetrics and Gynecology

## 2023-01-28 DIAGNOSIS — N6320 Unspecified lump in the left breast, unspecified quadrant: Secondary | ICD-10-CM

## 2023-02-24 ENCOUNTER — Other Ambulatory Visit: Payer: Self-pay | Admitting: Family Medicine

## 2023-02-24 DIAGNOSIS — N951 Menopausal and female climacteric states: Secondary | ICD-10-CM

## 2023-04-23 ENCOUNTER — Other Ambulatory Visit: Payer: Self-pay | Admitting: Obstetrics and Gynecology

## 2023-04-23 ENCOUNTER — Ambulatory Visit
Admission: RE | Admit: 2023-04-23 | Discharge: 2023-04-23 | Disposition: A | Discharge status: Home / Self Care | Payer: Commercial Managed Care - PPO | Source: Ambulatory Visit | Attending: Obstetrics and Gynecology | Admitting: Obstetrics and Gynecology

## 2023-04-23 ENCOUNTER — Ambulatory Visit: Admission: RE | Admit: 2023-04-23 | Payer: Commercial Managed Care - PPO | Source: Ambulatory Visit

## 2023-04-23 DIAGNOSIS — N6320 Unspecified lump in the left breast, unspecified quadrant: Secondary | ICD-10-CM

## 2023-06-03 ENCOUNTER — Ambulatory Visit
Admission: RE | Admit: 2023-06-03 | Discharge: 2023-06-03 | Disposition: A | Discharge status: Home / Self Care | Payer: Commercial Managed Care - PPO | Source: Ambulatory Visit | Attending: Family Medicine | Admitting: Family Medicine

## 2023-06-03 DIAGNOSIS — N951 Menopausal and female climacteric states: Secondary | ICD-10-CM

## 2023-08-26 ENCOUNTER — Other Ambulatory Visit: Payer: Commercial Managed Care - PPO

## 2023-09-28 IMAGING — MG MM BREAST LOCALIZATION CLIP
4 series · 4 of 12 positions shown · non-contrast
Comparison: Previous exam(s).

CLINICAL DATA: Status post ultrasound-guided core needle biopsy of
an 8 mm mass in the 2 o'clock position of the left breast.

EXAM:
3D DIAGNOSTIC LEFT MAMMOGRAM POST ULTRASOUND BIOPSY

[L ML synth-2D]
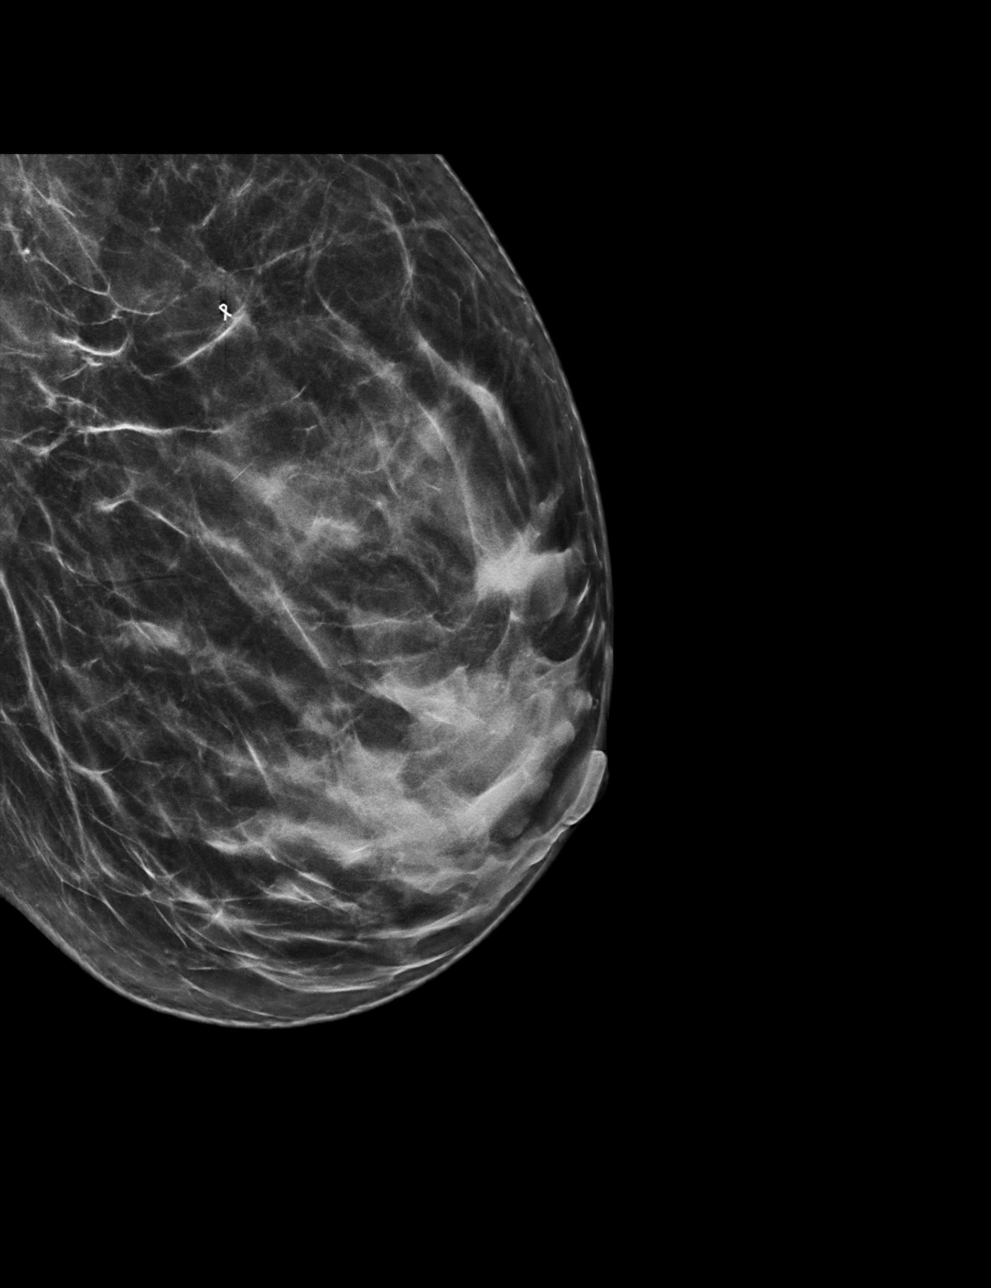

[L CC synth-2D]
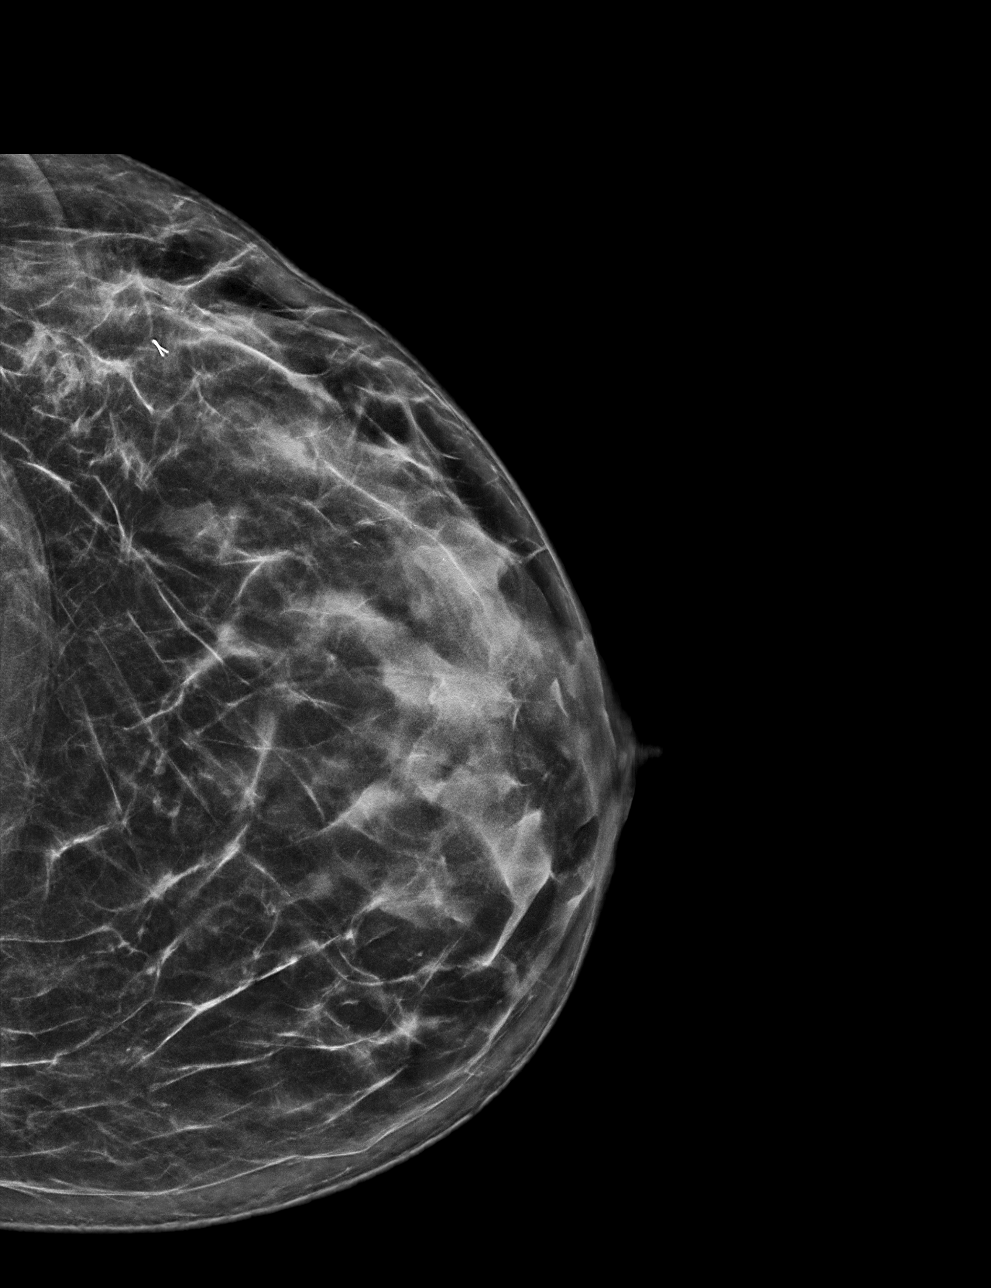

[L ML tomo · tomo slice 33/64.0]
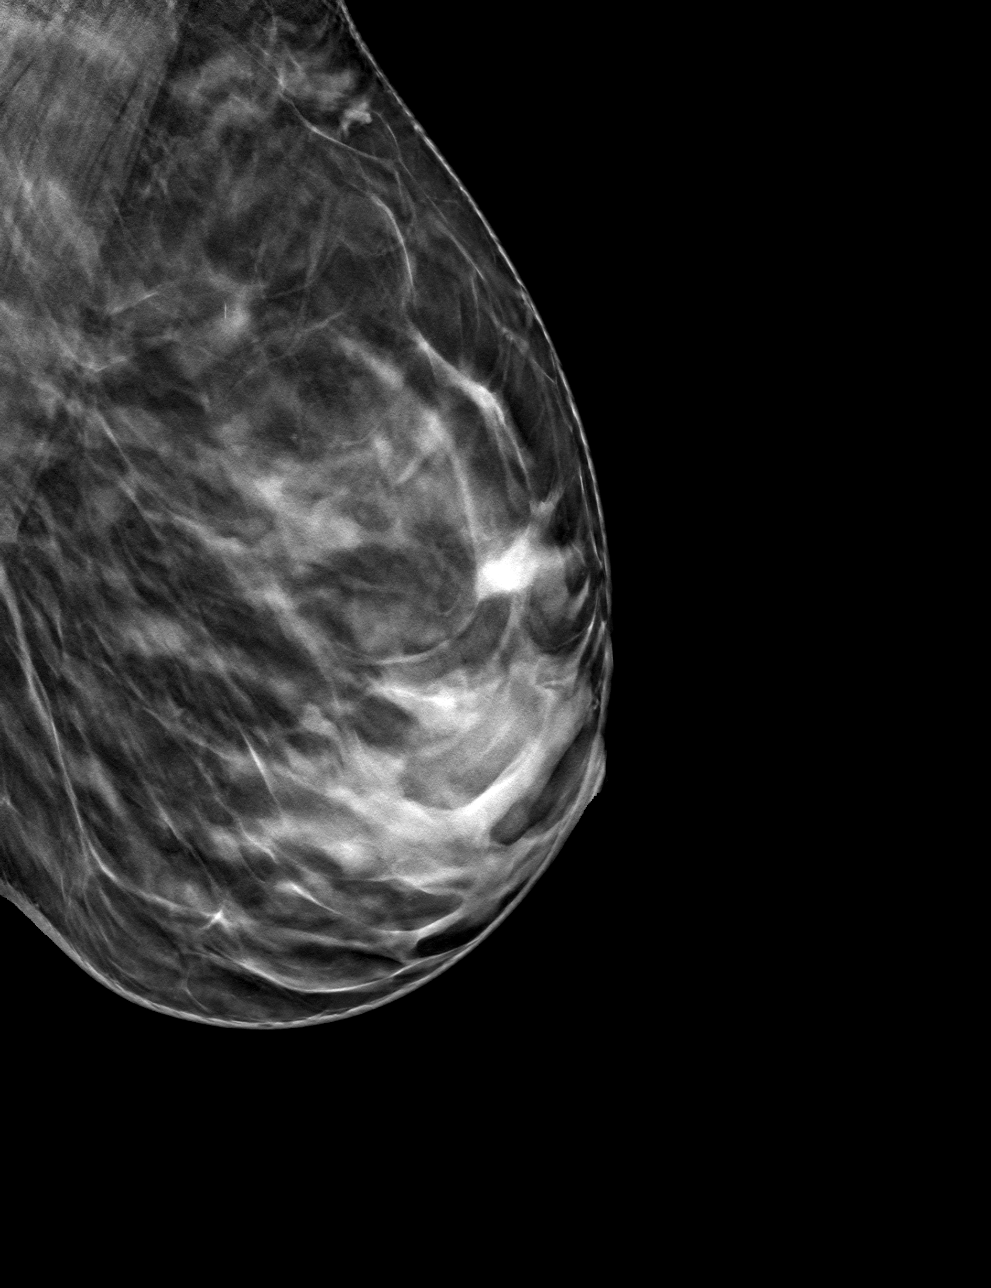

[L CC tomo · tomo slice 33/65.0]
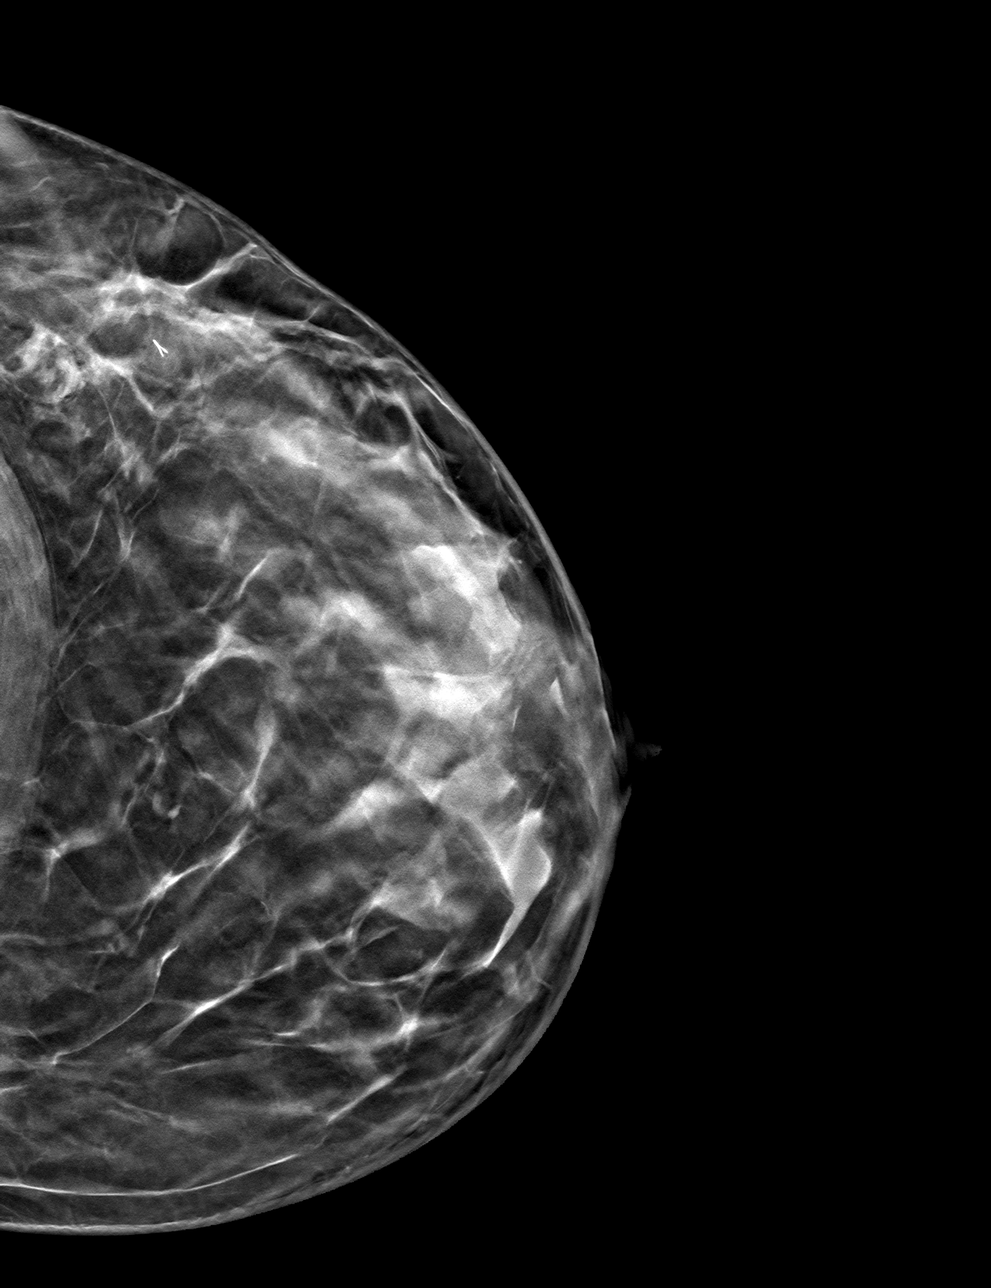

[4 of 12 positions shown; findings below may reference images not displayed]

FINDINGS: 3D Mammographic images were obtained following ultrasound guided
biopsy of an 8 mm mass in the 2 o'clock position of the left breast.
The biopsy marking clip is in expected position at the site of
biopsy.
IMPRESSION: Appropriate positioning of the ribbon shaped biopsy marking clip at
the site of biopsy in the 2 o'clock position of the left breast.

Final Assessment: Post Procedure Mammograms for Marker Placement

## 2024-03-15 ENCOUNTER — Other Ambulatory Visit: Payer: Self-pay | Admitting: Obstetrics and Gynecology

## 2024-03-15 DIAGNOSIS — Z1231 Encounter for screening mammogram for malignant neoplasm of breast: Secondary | ICD-10-CM

## 2024-04-24 ENCOUNTER — Ambulatory Visit
Admission: RE | Admit: 2024-04-24 | Discharge: 2024-04-24 | Disposition: A | Discharge status: Home / Self Care | Source: Ambulatory Visit | Attending: Obstetrics and Gynecology | Admitting: Obstetrics and Gynecology

## 2024-04-24 DIAGNOSIS — Z1231 Encounter for screening mammogram for malignant neoplasm of breast: Secondary | ICD-10-CM
